# Patient Record
Sex: Male | Born: 2000 | Race: White | Hispanic: No | Marital: Single | Smoking: Never smoker
Health system: Southern US, Community
[De-identification: ages and names within clinical notes are randomized; demographics above are authoritative.]

---

## 2019-08-01 ENCOUNTER — Other Ambulatory Visit: Payer: Self-pay

## 2019-08-01 DIAGNOSIS — Z20822 Contact with and (suspected) exposure to covid-19: Secondary | ICD-10-CM

## 2019-08-02 LAB — NOVEL CORONAVIRUS, NAA: SARS-CoV-2, NAA: NOT DETECTED

## 2020-02-04 ENCOUNTER — Ambulatory Visit: Payer: Self-pay | Attending: Internal Medicine

## 2020-02-04 ENCOUNTER — Other Ambulatory Visit: Payer: Self-pay

## 2020-02-04 DIAGNOSIS — Z23 Encounter for immunization: Secondary | ICD-10-CM

## 2020-02-04 NOTE — Progress Notes (Signed)
   Covid-19 Vaccination Clinic  Name:  Devon Wright    MRN: [id number] DOB: [birth year]  02/04/2020  [name] was observed post Covid-19 immunization for 15 minutes without incident. He was provided with Vaccine Information Sheet and instruction to access the V-Safe system.   [name] was instructed to call 911 with any severe reactions post vaccine: Marland Kitchen Difficulty breathing  . Swelling of face and throat  . A fast heartbeat  . A bad rash all over body  . Dizziness and weakness   Immunizations Administered    Name Date Dose VIS Date Route   Pfizer COVID-19 Vaccine 02/04/2020  2:47 PM 0.3 mL 10/19/2019 Intramuscular   Manufacturer: [employer], [employer]   Lot: [id number]   NDC: [address]-7

## 2020-02-27 ENCOUNTER — Ambulatory Visit: Payer: Self-pay | Attending: Internal Medicine

## 2020-02-27 DIAGNOSIS — Z23 Encounter for immunization: Secondary | ICD-10-CM

## 2020-02-27 NOTE — Progress Notes (Signed)
   Covid-19 Vaccination Clinic  Name:  Colyn Miron    MRN: [id number] DOB: [birth year]  02/27/2020  Mr. Rhoads was observed post Covid-19 immunization for 15 minutes without incident. He was provided with Vaccine Information Sheet and instruction to access the V-Safe system.   Mr. Gramm was instructed to call 911 with any severe reactions post vaccine: Marland Kitchen Difficulty breathing  . Swelling of face and throat  . A fast heartbeat  . A bad rash all over body  . Dizziness and weakness   Immunizations Administered    Name Date Dose VIS Date Route   Pfizer COVID-19 Vaccine 02/27/2020  2:54 PM 0.3 mL 01/02/2019 Intramuscular   Manufacturer: [employer], [employer]   Lot: [id number]   NDC: [address]-3

## 2020-07-26 ENCOUNTER — Other Ambulatory Visit: Payer: Self-pay

## 2020-07-26 ENCOUNTER — Emergency Department: Payer: 59

## 2020-07-26 ENCOUNTER — Emergency Department
Admission: EM | Admit: 2020-07-26 | Discharge: 2020-07-26 | Disposition: A | Discharge status: Home / Self Care | Payer: 59 | Attending: Emergency Medicine | Admitting: Emergency Medicine

## 2020-07-26 ENCOUNTER — Encounter: Payer: Self-pay | Admitting: Emergency Medicine

## 2020-07-26 DIAGNOSIS — W268XXA Contact with other sharp object(s), not elsewhere classified, initial encounter: Secondary | ICD-10-CM | POA: Diagnosis not present

## 2020-07-26 DIAGNOSIS — S61210A Laceration without foreign body of right index finger without damage to nail, initial encounter: Secondary | ICD-10-CM | POA: Diagnosis not present

## 2020-07-26 DIAGNOSIS — Y9356 Activity, jumping rope: Secondary | ICD-10-CM | POA: Insufficient documentation

## 2020-07-26 DIAGNOSIS — Y92007 Garden or yard of unspecified non-institutional (private) residence as the place of occurrence of the external cause: Secondary | ICD-10-CM | POA: Insufficient documentation

## 2020-07-26 MED ORDER — LIDOCAINE HCL (PF) 1 % IJ SOLN
5.0000 mL | Freq: Once | INTRAMUSCULAR | Status: AC
  Administered 2020-07-26: 5 mL via INTRADERMAL
  Filled 2020-07-26: qty 5

## 2020-07-26 MED ORDER — CEPHALEXIN 500 MG PO CAPS: 500.0000 mg | ORAL_CAPSULE | Freq: Four times a day (QID) | ORAL | 0 refills | Status: AC

## 2020-07-26 NOTE — ED Notes (Signed)
Pt presents to the ED after trying to help someone climb over a fence last night. Pt states that the spike of the fence impaled his R index finger. On assessment, pt has a small 1in laceration on the R index finger. Pt states he is able to bend it and denies any loss of sensation at this time. Pt is A&Ox4 and NAD.

## 2020-07-26 NOTE — ED Provider Notes (Signed)
[facility] Emergency Department Provider Note  ____________________________________________  Time seen: Approximately 7:33 AM  I have reviewed the triage vital signs and the nursing notes.   HISTORY  Chief Complaint Laceration (Right index )    HPI Devon Wright is a 19 y.o. male that presents to the [facility] department for evaluation of right finger laceration. Patient was trying to jump over a fence last night around 11:30 pm when his finger got cut on the fence. He is bending his finger normally. Last tetanus shot was this summer.   History reviewed. No pertinent past medical history.  There are no problems to display for this patient.   History reviewed. No pertinent surgical history.  Prior to Admission medications   Medication Sig Start Date End Date Taking? Authorizing Provider  cephALEXin (KEFLEX) 500 MG capsule Take 1 capsule (500 mg total) by mouth 4 (four) times daily for 10 days. 07/26/20 08/05/20  Enid Derry, PA-C    Allergies Patient has no allergy information on record.  No family history on file.  Social History Social History   Tobacco Use  . Smoking status: Never Smoker  Substance Use Topics  . Alcohol use: Yes  . Drug use: Yes    Types: Marijuana     Review of Systems  Constitutional: No fever/chills Gastrointestinal: No nausea, no vomiting.  Musculoskeletal: Positive for finger pain. Skin: Negative for rash, abrasions, ecchymosis. Positive for laceration. Neurological: Negative for numbness or tingling   ____________________________________________   PHYSICAL EXAM:  VITAL SIGNS: ED Triage Vitals  Enc Vitals Group     BP 07/26/20 0210 119/71     Pulse Rate 07/26/20 0210 72     Resp 07/26/20 0210 20     Temp 07/26/20 0210 98 F (36.7 C)     Temp Source 07/26/20 0210 Oral     SpO2 07/26/20 0210 97 %     Weight 07/26/20 0211 245 lb (111.1 kg)     Height 07/26/20 0211 6\' 6"  (1.981 m)     Head  Circumference --      Peak Flow --      Pain Score 07/26/20 0210 3     Pain Loc --      Pain Edu? --      Excl. in GC? --      Constitutional: Alert and oriented. Well appearing and in no acute distress. Eyes: Conjunctivae are normal. PERRL. EOMI. Head: Atraumatic. ENT:      Ears:      Nose: No congestion/rhinnorhea.      Mouth/Throat: Mucous membranes are moist.  Neck: No stridor.  Cardiovascular: Normal rate, regular rhythm.  Good peripheral circulation. Respiratory: Normal respiratory effort without tachypnea or retractions. Lungs CTAB. Good air entry to the bases with no decreased or absent breath sounds Musculoskeletal: Full range of motion to all extremities. No gross deformities appreciated. Full ROM of right finger. Neurologic:  Normal speech and language. No gross focal neurologic deficits are appreciated.  Skin:  Skin is warm, dry. W shaped flap laceration to right mid index finger. Psychiatric: Mood and affect are normal. Speech and behavior are normal. Patient exhibits appropriate insight and judgement.   ____________________________________________   LABS (all labs ordered are listed, but only abnormal results are displayed)  Labs Reviewed - No data to display ____________________________________________  EKG   ____________________________________________  RADIOLOGY 07/28/20, personally viewed and evaluated these images (plain radiographs) as part of my medical decision making, as well as reviewing  the written report by the radiologist.  DG Finger Index Right  Result Date: 07/26/2020 CLINICAL DATA:  Right index finger laceration. EXAM: RIGHT INDEX FINGER 2+V COMPARISON:  None. FINDINGS: Three view radiograph right index finger demonstrates normal alignment. No fracture or dislocation. Joint spaces are preserved. Soft tissue irregularity is seen along the volar and ulnar aspect of the middle phalanx in keeping with the given history of soft tissue  laceration. No retained radiopaque foreign body. IMPRESSION: No acute fracture or dislocation. Electronically Signed   By: Helyn Numbers MD   On: 07/26/2020 02:54    ____________________________________________    PROCEDURES  Procedure(s) performed:    Procedures  LACERATION REPAIR Performed by: Enid Derry  Consent: Verbal consent obtained.  Consent given by: patient  Prepped and Draped in normal sterile fashion  Wound explored: No foreign bodies   Laceration Location: index finger  Anesthesia: None  Local anesthetic: lidocaine 1% without epinephrine  Anesthetic total: 3 ml  Irrigation method: syringe  Amount of cleaning: normal saline  Skin closure: 4-0 nylon  Number of sutures: 4  Technique: Simple interrupted  Patient tolerance: Patient tolerated the procedure well with no immediate complications.   Medications  lidocaine (PF) (XYLOCAINE) 1 % injection 5 mL (5 mLs Intradermal Given by Other 07/26/20 0841)     ____________________________________________   INITIAL IMPRESSION / ASSESSMENT AND PLAN / ED COURSE  Pertinent labs & imaging results that were available during my care of the patient were reviewed by me and considered in my medical decision making (see chart for details).  Review of the [place] CSRS was performed in accordance of the [facility] prior to dispensing any controlled drugs.   Patient's diagnosis is consistent with finger laceration. Vital signs and exam are reassuring. Laceration was repaired with sutures. Wound was bandaged. Patient will be discharged home with prescriptions for keflex. Patient is to follow up with PCP as directed. Patient is given ED precautions to return to the ED for any worsening or new symptoms.  Devon Wright was evaluated in Emergency Department on 07/26/2020 for the symptoms described in the history of present illness. He was evaluated in the context of the global COVID-19 pandemic, which necessitated  consideration that the patient might be at risk for infection with the SARS-CoV-2 virus that causes COVID-19. Institutional protocols and algorithms that pertain to the evaluation of patients at risk for COVID-19 are in a state of rapid change based on information released by regulatory bodies including the CDC and federal and state organizations. These policies and algorithms were followed during the patient's care in the ED.   ____________________________________________  FINAL CLINICAL IMPRESSION(S) / ED DIAGNOSES  Final diagnoses:  Laceration of right index finger without foreign body without damage to nail, initial encounter      NEW MEDICATIONS STARTED DURING THIS VISIT:  ED Discharge Orders         Ordered    cephALEXin (KEFLEX) 500 MG capsule  4 times daily        07/26/20 0830              This chart was dictated using voice recognition software/Dragon. Despite best efforts to proofread, errors can occur which can change the meaning. Any change was purely unintentional.    Enid Derry, PA-C 07/26/20 1007    Vicente Males Clent Jacks, MD 07/26/20 [phone]

## 2020-07-26 NOTE — ED Triage Notes (Signed)
Pt reports he was helping a friend to get over a fence and finger got stuck in fence metal, pt has laceration to right index finger, bleeding at present, applied dressing to finger, reports Tdap up to date. Pt denies any other symptom.

## 2021-09-20 IMAGING — CR DG FINGER INDEX 2+V*R*
1 series · 3 of 3 positions shown · non-contrast
Comparison: None.

CLINICAL DATA: Right index finger laceration.

EXAM:
RIGHT INDEX FINGER 2+V

[Series 1: dg finger index right · 0.14mm/px · 3 of 3 slices shown]
[im 1/3]
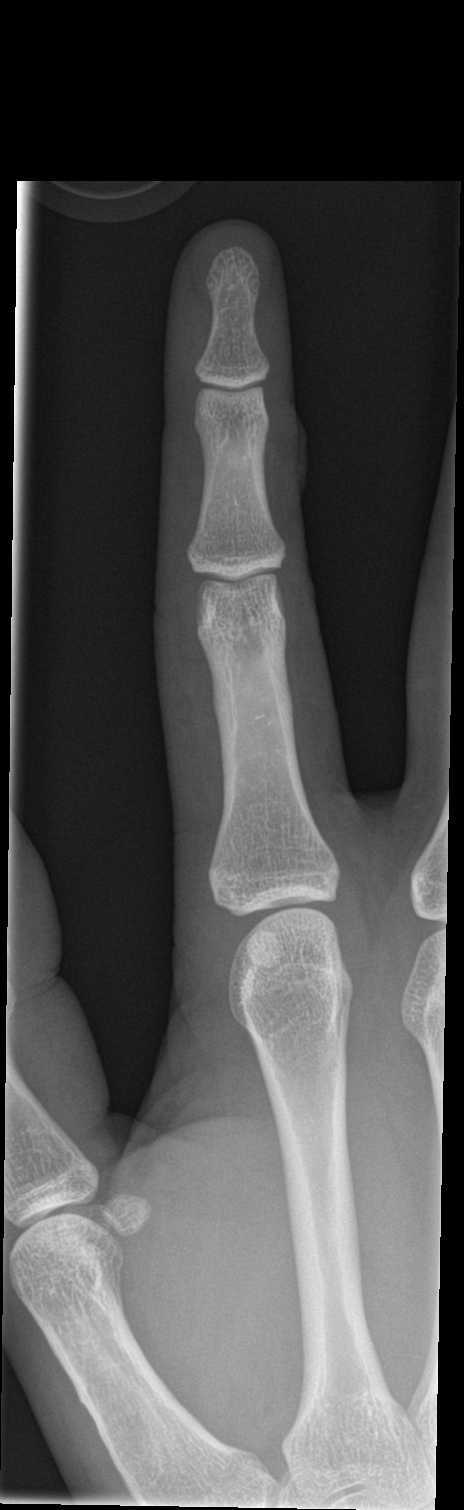
[im 2/3]
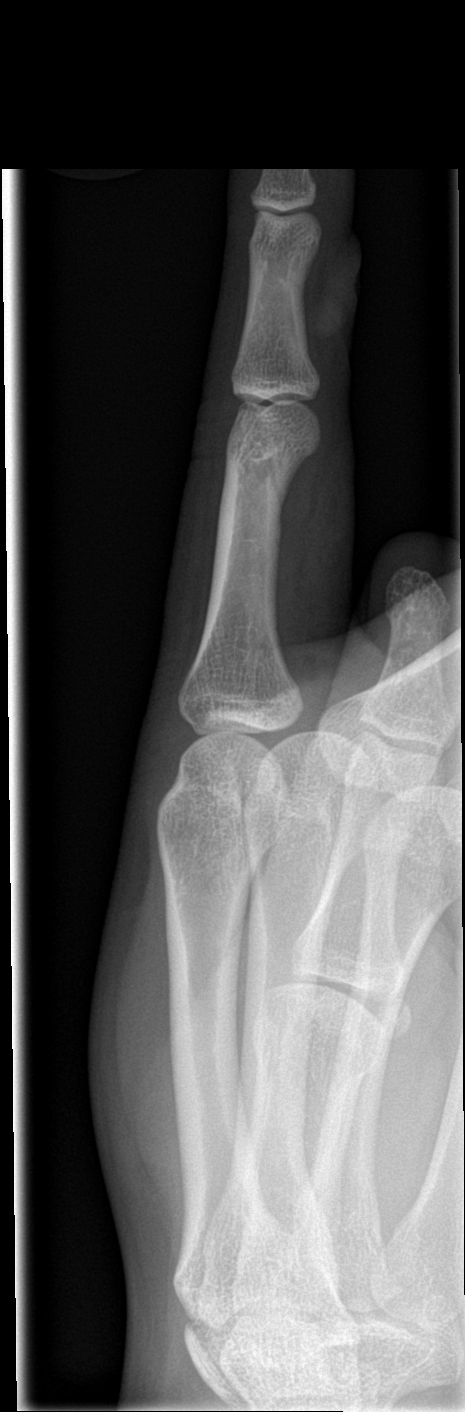
[im 3/3]
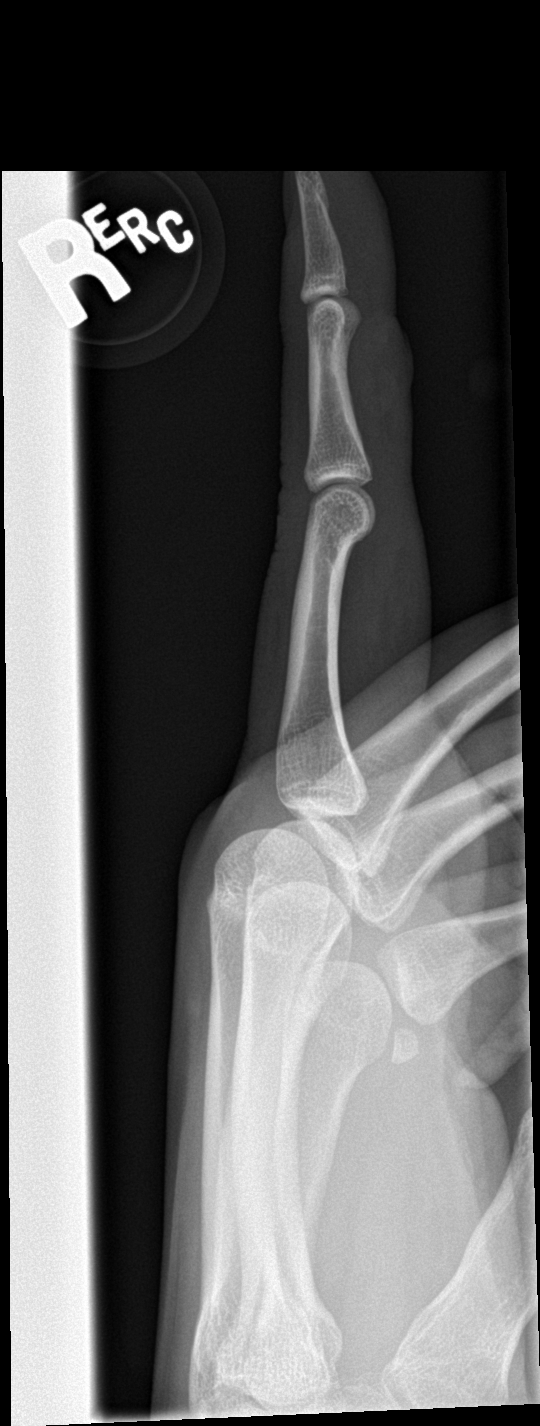

[3 of 3 positions shown; findings below may reference images not displayed]

FINDINGS: Three view radiograph right index finger demonstrates normal
alignment. No fracture or dislocation. Joint spaces are preserved.
Soft tissue irregularity is seen along the volar and ulnar aspect of
the middle phalanx in keeping with the given history of soft tissue
laceration. No retained radiopaque foreign body.
IMPRESSION: No acute fracture or dislocation.
